# Patient Record
Sex: Female | Born: 1950 | Race: White | Hispanic: No | Marital: Married | Smoking: Never smoker
Health system: Southern US, Community
[De-identification: ages and names within clinical notes are randomized; demographics above are authoritative.]

## PROBLEM LIST (undated history)

## (undated) HISTORY — PX: CHOLECYSTECTOMY: SHX55

## (undated) HISTORY — PX: BREAST BIOPSY: SHX20

## (undated) HISTORY — PX: SPLENECTOMY, TOTAL: SHX788

---

## 1998-10-07 ENCOUNTER — Other Ambulatory Visit: Admission: RE | Admit: 1998-10-07 | Discharge: 1998-10-07 | Payer: Self-pay | Admitting: Obstetrics & Gynecology

## 1998-12-24 ENCOUNTER — Inpatient Hospital Stay (HOSPITAL_COMMUNITY): Admission: AD | Admit: 1998-12-24 | Discharge: 1998-12-24 | Payer: Self-pay | Admitting: Obstetrics & Gynecology

## 2001-01-12 ENCOUNTER — Other Ambulatory Visit: Admission: RE | Admit: 2001-01-12 | Discharge: 2001-01-12 | Payer: Self-pay | Admitting: Obstetrics and Gynecology

## 2003-05-13 ENCOUNTER — Other Ambulatory Visit: Admission: RE | Admit: 2003-05-13 | Discharge: 2003-05-13 | Payer: Self-pay | Admitting: Obstetrics & Gynecology

## 2004-06-01 ENCOUNTER — Other Ambulatory Visit: Admission: RE | Admit: 2004-06-01 | Discharge: 2004-06-01 | Payer: Self-pay | Admitting: Obstetrics & Gynecology

## 2005-06-03 ENCOUNTER — Other Ambulatory Visit: Admission: RE | Admit: 2005-06-03 | Discharge: 2005-06-03 | Payer: Self-pay | Admitting: Obstetrics & Gynecology

## 2008-03-03 ENCOUNTER — Emergency Department (HOSPITAL_COMMUNITY): Admission: EM | Admit: 2008-03-03 | Discharge: 2008-03-03 | Payer: Self-pay | Admitting: Emergency Medicine

## 2011-03-05 ENCOUNTER — Ambulatory Visit (INDEPENDENT_AMBULATORY_CARE_PROVIDER_SITE_OTHER): Payer: BC Managed Care – PPO

## 2011-03-05 ENCOUNTER — Inpatient Hospital Stay (INDEPENDENT_AMBULATORY_CARE_PROVIDER_SITE_OTHER)
Admission: RE | Admit: 2011-03-05 | Discharge: 2011-03-05 | Disposition: A | Discharge status: Home / Self Care | Payer: BC Managed Care – PPO | Source: Ambulatory Visit | Attending: Family Medicine | Admitting: Family Medicine

## 2011-03-05 DIAGNOSIS — S9030XA Contusion of unspecified foot, initial encounter: Secondary | ICD-10-CM

## 2011-03-16 ENCOUNTER — Inpatient Hospital Stay (INDEPENDENT_AMBULATORY_CARE_PROVIDER_SITE_OTHER)
Admission: RE | Admit: 2011-03-16 | Discharge: 2011-03-16 | Disposition: A | Discharge status: Home / Self Care | Payer: BC Managed Care – PPO | Source: Ambulatory Visit | Attending: Family Medicine | Admitting: Family Medicine

## 2011-03-16 ENCOUNTER — Ambulatory Visit (INDEPENDENT_AMBULATORY_CARE_PROVIDER_SITE_OTHER): Payer: BC Managed Care – PPO

## 2011-03-16 DIAGNOSIS — S92309A Fracture of unspecified metatarsal bone(s), unspecified foot, initial encounter for closed fracture: Secondary | ICD-10-CM

## 2012-02-21 ENCOUNTER — Emergency Department (HOSPITAL_COMMUNITY)
Admission: EM | Admit: 2012-02-21 | Discharge: 2012-02-21 | Disposition: A | Discharge status: Home / Self Care | Payer: BC Managed Care – PPO | Source: Home / Self Care | Attending: Family Medicine | Admitting: Family Medicine

## 2012-02-21 ENCOUNTER — Encounter (HOSPITAL_COMMUNITY): Payer: Self-pay | Admitting: Emergency Medicine

## 2012-02-21 DIAGNOSIS — R071 Chest pain on breathing: Secondary | ICD-10-CM

## 2012-02-21 MED ORDER — IBUPROFEN 600 MG PO TABS: 600.0000 mg | ORAL_TABLET | Freq: Three times a day (TID) | ORAL | Status: AC

## 2012-02-21 MED ORDER — ACETAMINOPHEN-CODEINE #3 300-30 MG PO TABS: 1.0000 | ORAL_TABLET | Freq: Four times a day (QID) | ORAL | Status: AC | PRN

## 2012-02-21 NOTE — ED Provider Notes (Signed)
History     CSN: [id number]  Arrival date & time 02/21/12  1547   First MD Initiated Contact with Patient 02/21/12 1556      Chief Complaint  Patient presents with  . 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 Here complaining of bilateral pain in the lower ribs more in the anterior area. She has had this discomfort for about 3 weeks and started after she was sick with a cold and was coughing constantly. She states her cold and cough has completely resolved. But the bilateral rib cage pain although minimally improved remains constant. Denies fever or chills. Denies dizziness, palpitations or syncope. No leg swelling. Pain worse with movement, sneezing laughing or straining. Denies recent trauma or falls. No difficulty breathing other than triggering her pain with deep breath. Not taking any medications for her pain.    History reviewed. No pertinent past medical history.  Past Surgical History  Procedure Date  . Breast biopsy   . Splenectomy, total     No family history on file.  History  Substance Use Topics  . Smoking status: Never Smoker   . Smokeless tobacco: Not on file  . Alcohol Use: Yes    OB History    Grav Para Term Preterm Abortions TAB SAB Ect Mult Living                  Review of Systems  Constitutional: Negative for fever, chills and appetite change.  HENT: Negative for congestion.   Respiratory: Negative for cough, shortness of breath and wheezing.   Cardiovascular: Positive for chest pain. Negative for palpitations and leg swelling.  Gastrointestinal: Negative for nausea, vomiting, abdominal pain and diarrhea.  Musculoskeletal:       As per HPI  Skin: Negative for rash.  Neurological: Negative for dizziness and headaches.    Allergies  Review of  patient's allergies indicates no known allergies.  Home Medications   Current Outpatient Rx  Name Route Sig Dispense Refill  . SERTRALINE HCL 100 MG PO TABS Oral Take 100 mg by mouth daily.    . ACETAMINOPHEN-CODEINE #3 300-30 MG PO TABS Oral Take 1-2 tablets by mouth every 6 (six) hours as needed for pain. 15 tablet 0  . IBUPROFEN 600 MG PO TABS Oral Take 1 tablet (600 mg total) by mouth 3 (three) times daily. 20 tablet 0    BP 117/69  Pulse 74  Temp(Src) 98.7 F (37.1 C) (Oral)  Resp 14  SpO2 97%  Physical Exam  Nursing note and vitals reviewed. Constitutional: She is oriented to person, place, and time. She appears well-developed and well-nourished. No distress.  HENT:  Head: Normocephalic and atraumatic.  Eyes: Conjunctivae are normal. Pupils are equal, round, and reactive to light. No scleral icterus.  Neck: No JVD present.  Cardiovascular: Normal rate, regular rhythm and normal heart sounds.  Exam reveals no gallop and no friction rub.   No murmur heard. Pulmonary/Chest: Effort normal and breath sounds normal. No respiratory distress. She has no wheezes. She has no rales.       Mild pectus scavatum at the expense of lower ribs. Tender lower bilateral costo-sternal joints with light palpation. No obvious deformity or crepitus while palpating ribs. No bruising or swelling. No skin rashes.  Otherwise normal chest expansion and bilateral lung exam.  Abdominal: Soft. Bowel sounds are normal. She exhibits  no distension. There is no tenderness.  Lymphadenopathy:    She has no cervical adenopathy.  Neurological: She is alert and oriented to person, place, and time.  Skin: No rash noted. She is not diaphoretic.    ED Course  Procedures (including critical care time)  Labs Reviewed - No data to display No results found.   1. Costochondral pain       MDM  Reproduced pain over costosternal joints by applying lower ribs pressure. Otherwise normal lung and cardiovascular  exam. Impress costochondritis. Treated symptomatically with ibuprofen 600 mg 3 times a day schedule for 5 days plus Tylenol #3 every 6 hours when necessary. Return if persistent pain after 1 week or return earlier if worsening or new symptoms.         Sharin Grave, MD 02/22/12 [phone]

## 2012-02-21 NOTE — ED Notes (Signed)
Reports 2-3 weeks of torso discomfort.  Pain around torso.  Pain worsens with any movement, laughter, cough.  Patient reports cough around [place] time.  Patient reports minimal improvement over the past few weeks.  Patient reports pain is wearing her down.

## 2012-02-21 NOTE — Discharge Instructions (Signed)
My impression is that you have pain involving the cartilage joints between your ribs and flat chest bone in the center of your chest that are likely inflamed due to recent coughing episodes. It is also possible your have abdominal muscles, fatigued or swollen from recent cough.  Take the prescribed medications as instructed. Take ibuprofen scheduled for the next 5 days taken with food as it can upset your stomach. You can also take over-the-counter Prilosec 20 mg one time daily while taking the ibuprofen. Can also alternate with Tylenol #3 every 6 hours as needed for pain. Avoid heavy lifting over 3 pounds for at least one week. Return or followup with your primary care provider if persistent and symptoms after 1 week. Return earlier if worsening or new symptoms despite following treatment.

## 2012-03-21 ENCOUNTER — Institutional Professional Consult (permissible substitution): Payer: BC Managed Care – PPO | Admitting: Pulmonary Disease

## 2012-05-07 IMAGING — CR DG FOOT COMPLETE 3+V*R*
3 series · 3 of 3 positions shown · non-contrast
Comparison: None

CLINICAL DATA: Trauma to the second and third metatarsals.  Soft
tissue swelling.

RIGHT FOOT COMPLETE - 3+ VIEW

[view not recorded (1 of 3)]
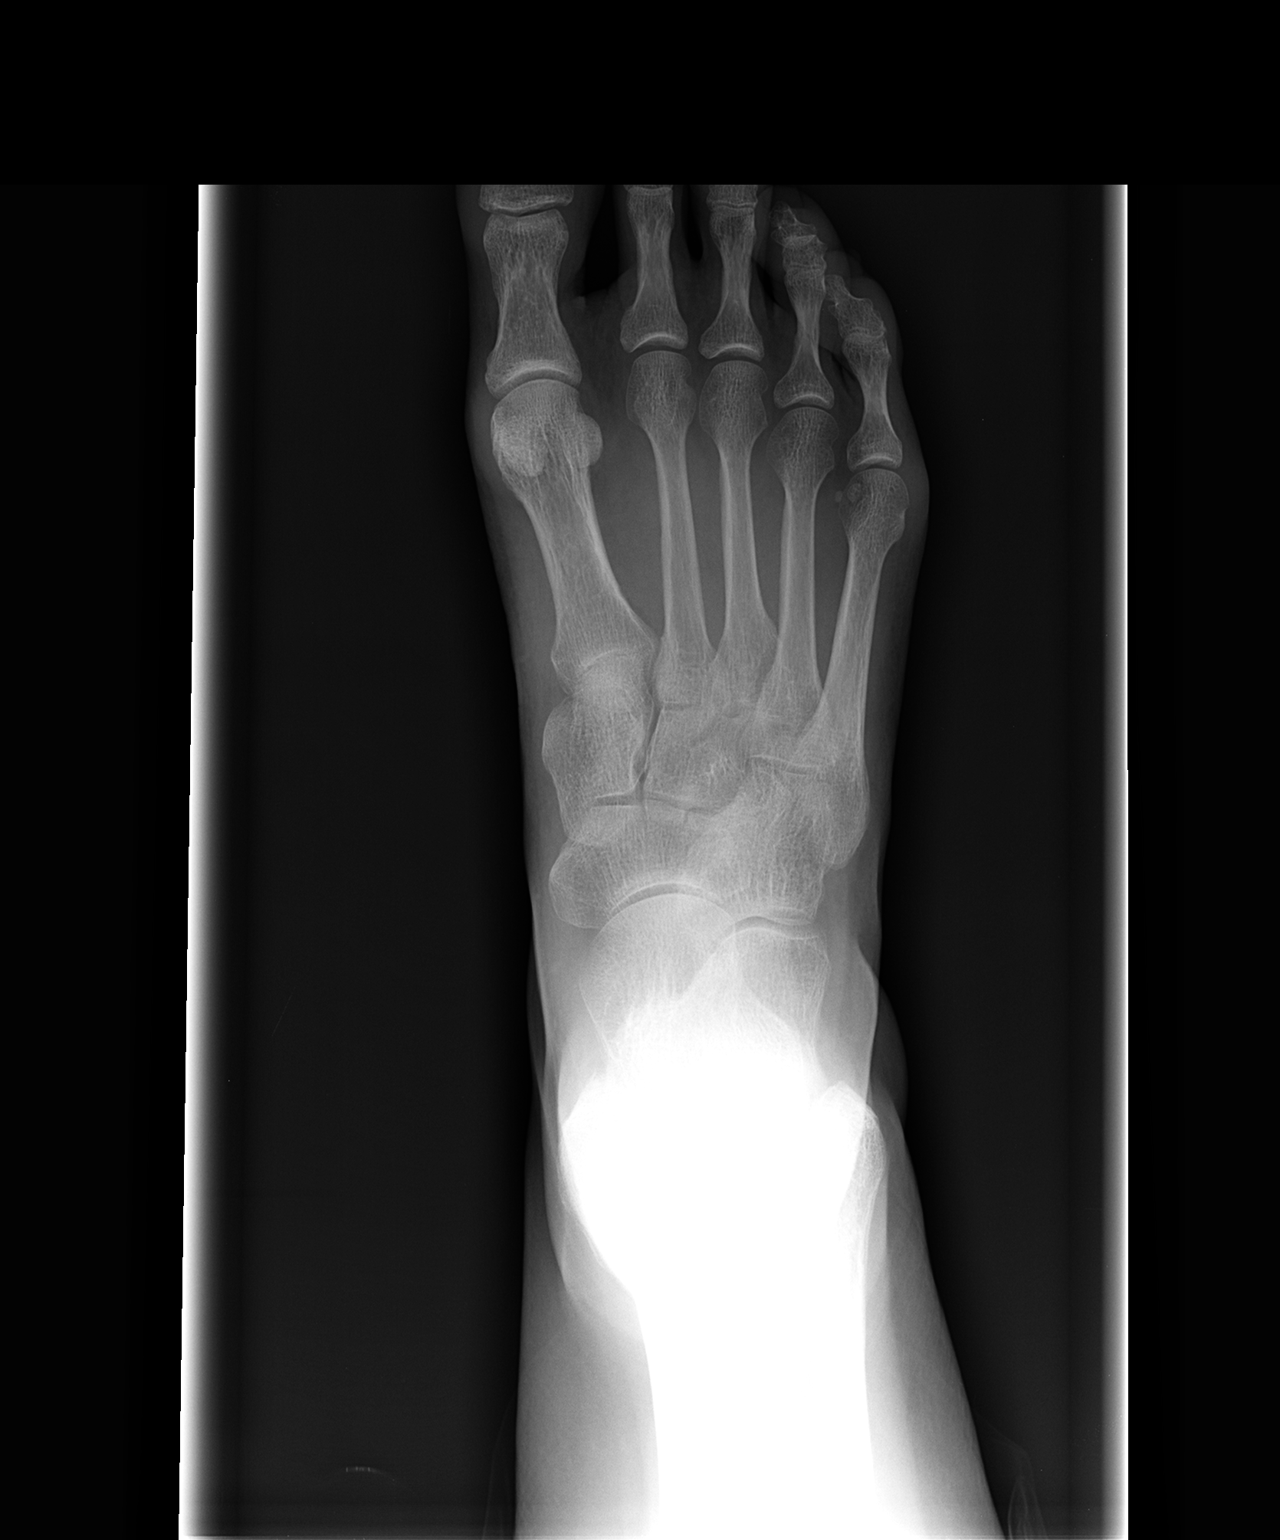

[view not recorded (2 of 3)]
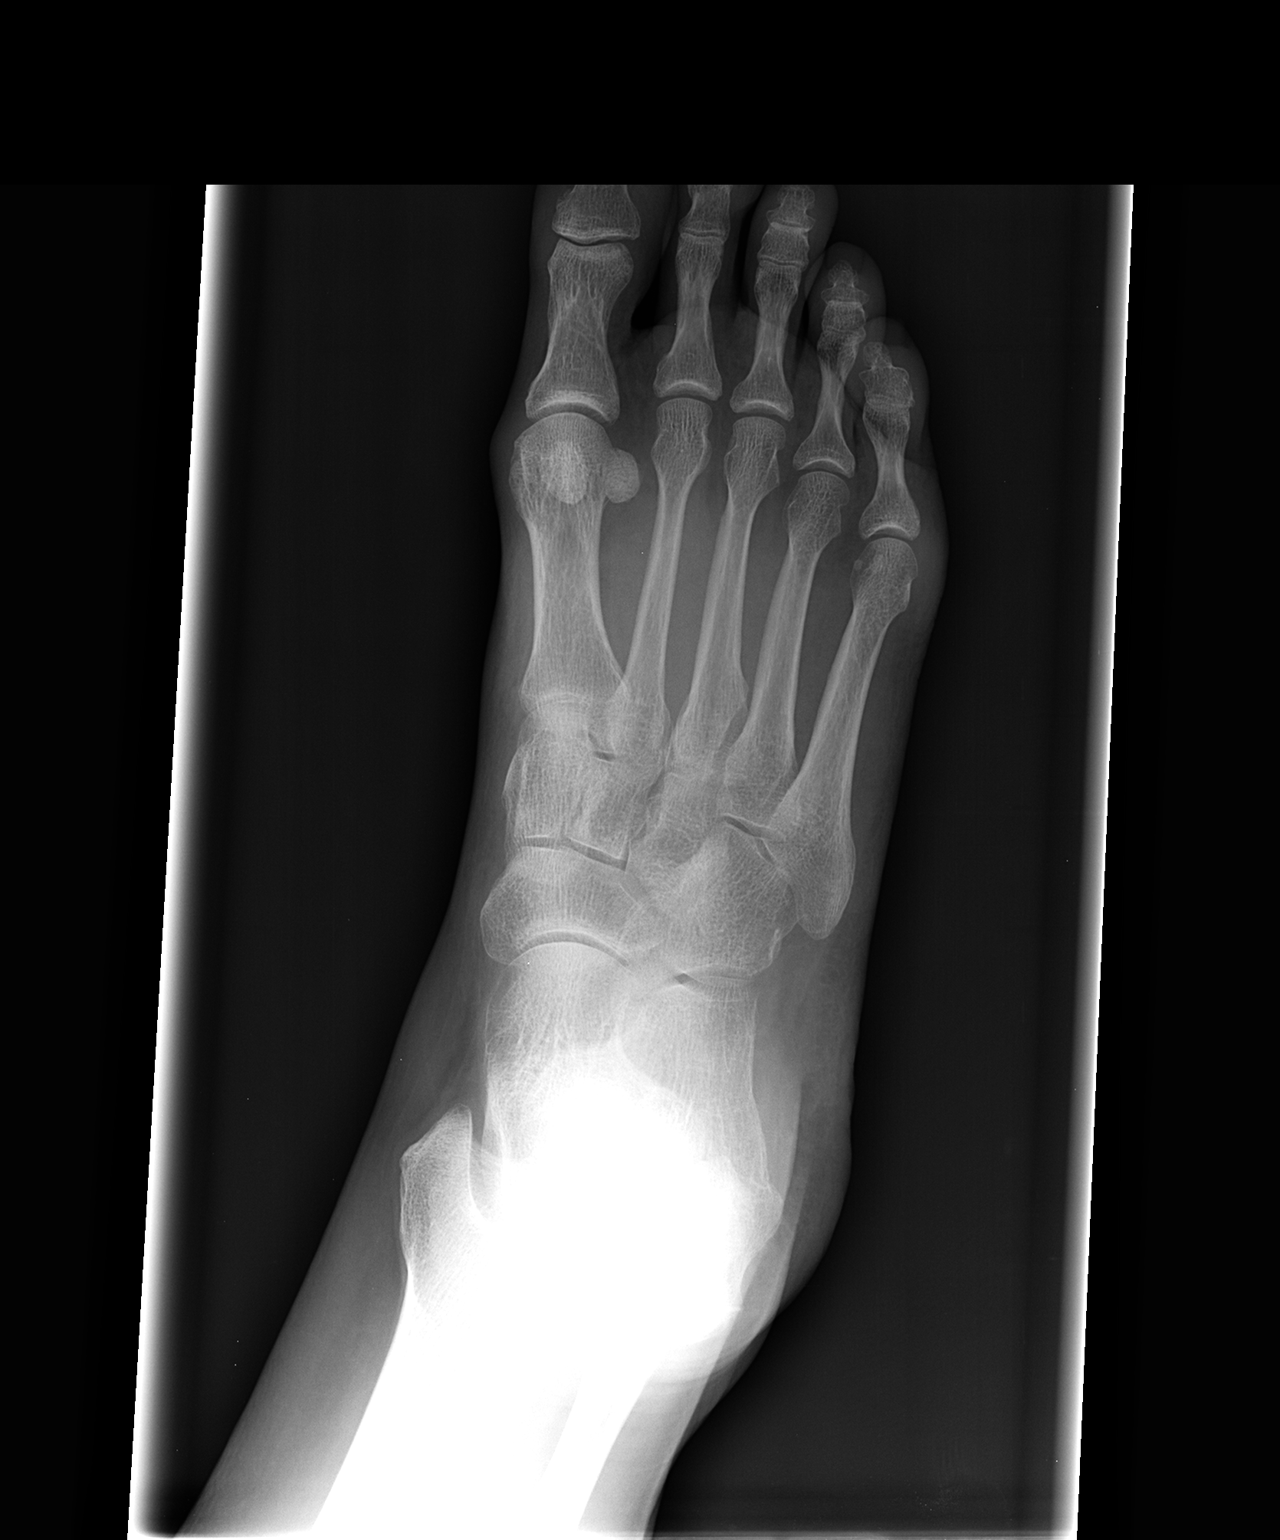

[view not recorded (3 of 3)]
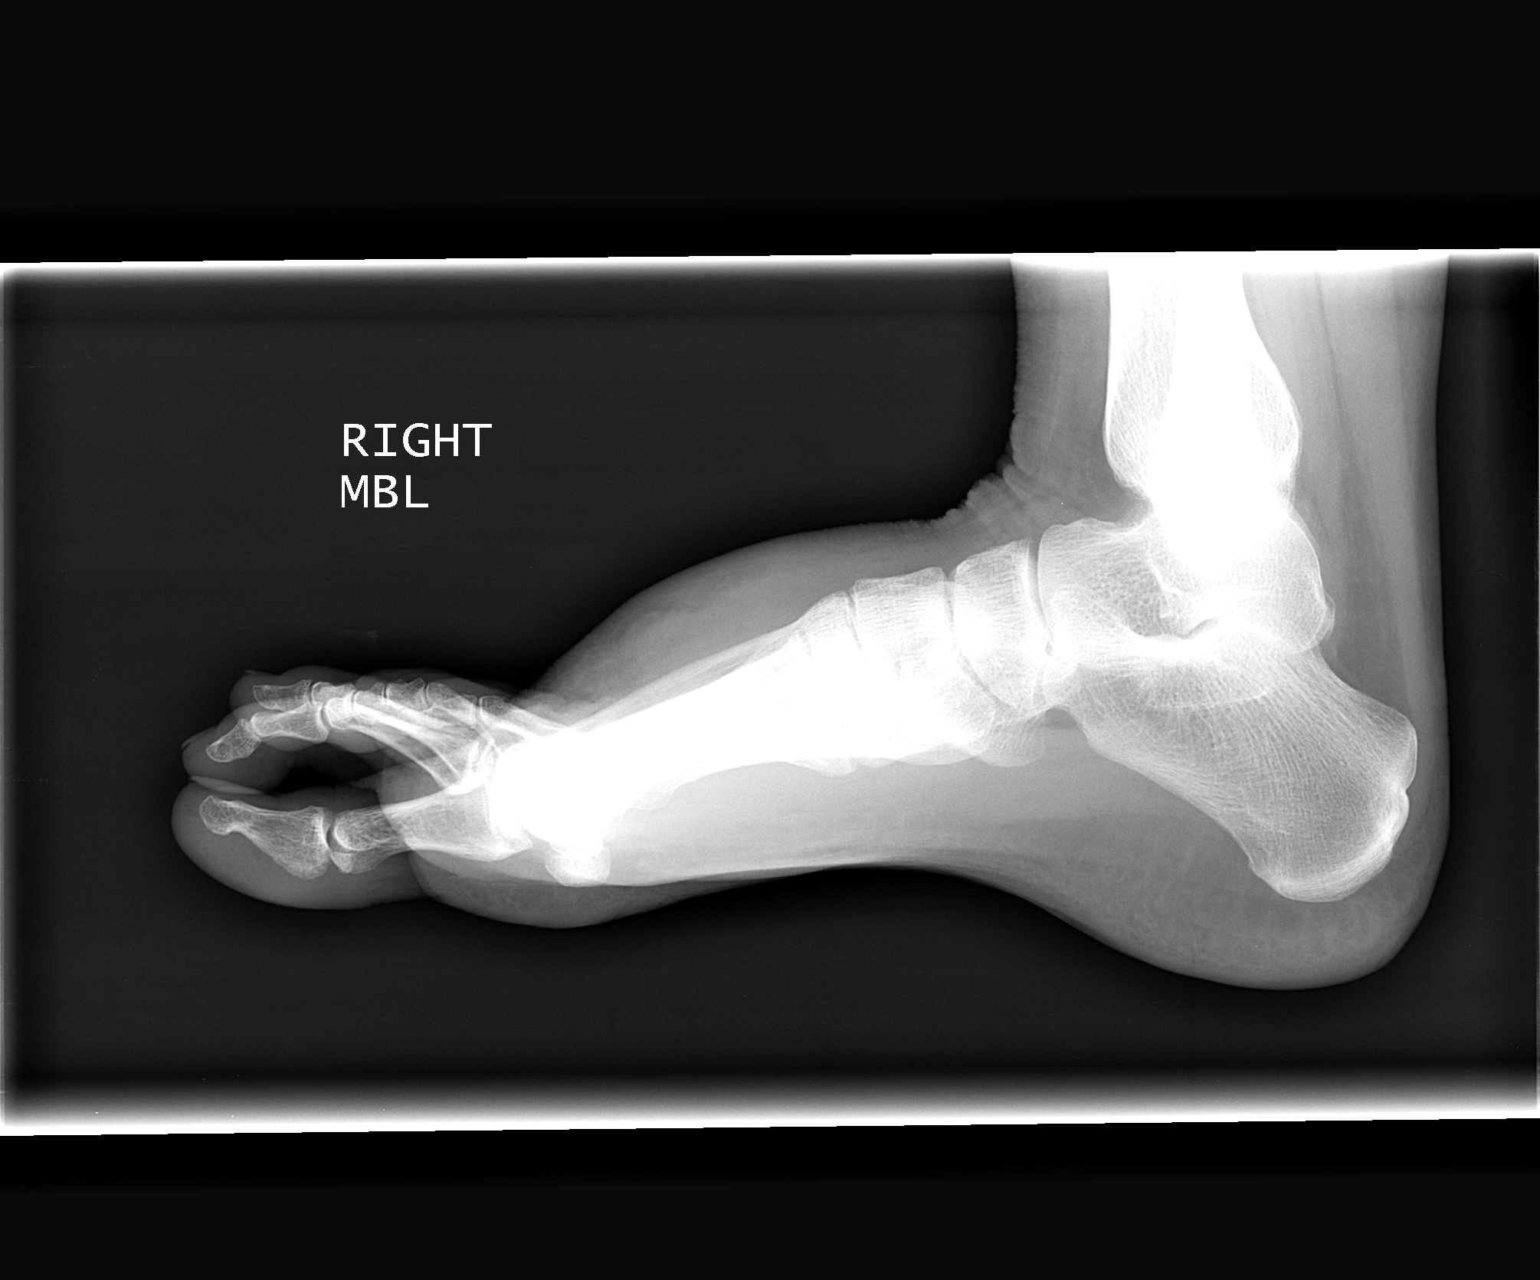

[3 of 3 positions shown; findings below may reference images not displayed]

FINDINGS: No evidence of fracture or dislocation.  No other bony
finding.  There is pronounced dorsal soft tissue swelling.
IMPRESSION: Soft tissue swelling.  No bony abnormality.  No radiopaque foreign
object.

## 2013-06-22 ENCOUNTER — Encounter (HOSPITAL_COMMUNITY): Payer: Self-pay | Admitting: Emergency Medicine

## 2013-06-22 ENCOUNTER — Emergency Department (HOSPITAL_COMMUNITY)
Admission: EM | Admit: 2013-06-22 | Discharge: 2013-06-22 | Disposition: A | Discharge status: Home / Self Care | Payer: BC Managed Care – PPO | Source: Home / Self Care

## 2013-06-22 DIAGNOSIS — L255 Unspecified contact dermatitis due to plants, except food: Secondary | ICD-10-CM

## 2013-06-22 MED ORDER — METHYLPREDNISOLONE 4 MG PO KIT: PACK | ORAL | Status: DC

## 2013-06-22 MED ORDER — TRIAMCINOLONE ACETONIDE 0.1 % EX CREA: TOPICAL_CREAM | Freq: Two times a day (BID) | CUTANEOUS | Status: DC

## 2013-06-22 NOTE — ED Provider Notes (Signed)
Medical screening examination/treatment/procedure(s) were performed by resident physician or non-physician practitioner and as supervising physician I was immediately available for consultation/collaboration.   Barkley Bruns MD.   Linna Hoff, MD 06/22/13 [phone]

## 2013-06-22 NOTE — ED Provider Notes (Signed)
  CSN: [id number]     Arrival date & time 06/22/13  8295 History     None    Chief Complaint  Patient presents with  . Poison Ivy   (Consider location/radiation/quality/duration/timing/severity/associated sxs/prior Treatment) HPI Comments: C/O poison ivy rash for 5 d. Pruritis. Located on small areas of upper and lower extremities, neck and waistline.   History reviewed. No pertinent past medical history. Past Surgical History  Procedure Laterality Date  . Breast biopsy    . Splenectomy, total     No family history on file. History  Substance Use Topics  . Smoking status: Never Smoker   . Smokeless tobacco: Not on file  . Alcohol Use: Yes   OB History   Grav Para Term Preterm Abortions TAB SAB Ect Mult Living                 Review of Systems  Constitutional: Negative for fever, chills, diaphoresis, activity change, appetite change and fatigue.  HENT: Negative for sore throat, facial swelling and trouble swallowing.   Skin: Positive for rash.    Allergies  Review of patient's allergies indicates no known allergies.  Home Medications   Current Outpatient Rx  Name  Route  Sig  Dispense  Refill  . sertraline (ZOLOFT) 100 MG tablet   Oral   Take 100 mg by mouth daily.         . methylPREDNISolone (MEDROL DOSEPAK) 4 MG tablet      follow package directions   21 tablet   0   . triamcinolone cream (KENALOG) 0.1 %   Topical   Apply topically 2 (two) times daily. Apply for 2 weeks. May use on face   30 g   0    BP 130/77  Pulse 77  Temp(Src) 98.2 F (36.8 C) (Oral)  Resp 16  SpO2 100% Physical Exam  Nursing note and vitals reviewed. Constitutional: She is oriented to person, place, and time. She appears well-developed and well-nourished. No distress.  HENT:  Mouth/Throat: Oropharynx is clear and moist.  Pulmonary/Chest: Effort normal.  Neurological: She is alert and oriented to person, place, and time. She exhibits normal muscle tone.  Skin: Skin is  warm and dry. Rash noted.  Crops of papulovesicular lesions, streaks on a red base located in areas described above.   Psychiatric: She has a normal mood and affect.    ED Course   Procedures (including critical care time)  Labs Reviewed - No data to display No results found. 1. Contact dermatitis and other eczema due to plants (except food)     MDM  Triamcinolone cream bid t affected areas Medrol dose pack #21 OTC caladryl gel  Hayden Rasmussen, NP 06/22/13 1005

## 2013-06-22 NOTE — ED Notes (Signed)
Pt c/o poison ivy onset 5 days Rash on left arm, and legs Denies: fevers... Alert w/no signs of acute distress.

## 2014-08-19 ENCOUNTER — Emergency Department (HOSPITAL_COMMUNITY)
Admission: EM | Admit: 2014-08-19 | Discharge: 2014-08-19 | Disposition: A | Discharge status: Home / Self Care | Payer: BC Managed Care – PPO | Source: Home / Self Care | Attending: Family Medicine | Admitting: Family Medicine

## 2014-08-19 ENCOUNTER — Encounter (HOSPITAL_COMMUNITY): Payer: Self-pay | Admitting: Emergency Medicine

## 2014-08-19 ENCOUNTER — Ambulatory Visit (HOSPITAL_COMMUNITY): Payer: BC Managed Care – PPO | Attending: Family Medicine

## 2014-08-19 DIAGNOSIS — R05 Cough: Secondary | ICD-10-CM

## 2014-08-19 DIAGNOSIS — Q676 Pectus excavatum: Secondary | ICD-10-CM | POA: Insufficient documentation

## 2014-08-19 DIAGNOSIS — R059 Cough, unspecified: Secondary | ICD-10-CM

## 2014-08-19 MED ORDER — BENZONATATE 100 MG PO CAPS: 100.0000 mg | ORAL_CAPSULE | Freq: Three times a day (TID) | ORAL | Status: AC | PRN

## 2014-08-19 MED ORDER — OMEPRAZOLE 40 MG PO CPDR
40.0000 mg | DELAYED_RELEASE_CAPSULE | Freq: Every day | ORAL | Status: DC
Start: 2014-08-19 — End: 2014-09-17

## 2014-08-19 MED ORDER — IPRATROPIUM-ALBUTEROL 0.5-2.5 (3) MG/3ML IN SOLN
RESPIRATORY_TRACT | Status: AC
  Filled 2014-08-19: qty 3

## 2014-08-19 MED ORDER — IPRATROPIUM-ALBUTEROL 0.5-2.5 (3) MG/3ML IN SOLN
3.0000 mL | Freq: Once | RESPIRATORY_TRACT | Status: AC
  Administered 2014-08-19: 3 mL via RESPIRATORY_TRACT

## 2014-08-19 NOTE — Discharge Instructions (Signed)
Thank you for coming in today. Take tessalon as needed.  Take over the counter zyrtec as needed.  If not getting better take omeprazole daily.  Call or go to the emergency room if you get worse, have trouble breathing, have chest pains, or palpitations.  Follow up with a primary doctor.   PRIMARY CARE Merchant navy officerDOCTORS [facility] [address]  [place], [place] [phone]  Fax [phone]  Nature conservation [facility] at [facility] [address]  [place], [place] [phone]  Fax [phone]  Nature conservation officerLeBauer [facility] at [facility] [place] / [place] [phone]  [place], [place] [phone]  Fax [phone]  [facility] [address]  [place], [place] [phone]  Fax [phone]  [facility] [address] [place]  [place], [place] [phone]  Fax [phone]  [facility] [address] [place]  [place], [place] [phone]  Fax [phone]   [facility] [place] [address] [place] [address] Phone: [phone]   [facility] @ [facility] [address] [place] [address] Phone: [phone]   [facility] @ [place] [address] [place] [address] Phone: [phone]   [facility] @ [place] [address] [place] [address] Phone: [phone]   [facility] @ [place] [address] [place] [address] Phone: [phone] Fax: [phone]   [facility] @ [place] [address] [place] [address] Phone: [phone]   Dr. Maryelizabeth RowanElizabeth Dewey [address] [place] [address] [phone]   Cough, Adult  A cough is a reflex that helps clear your throat and airways. It can help heal the body or  may be a reaction to an irritated airway. A cough may only last 2 or 3 weeks (acute) or may last more than 8 weeks (chronic).  CAUSES Acute cough:  Viral or bacterial infections. Chronic cough:  Infections.  Allergies.  Asthma.  Post-nasal drip.  Smoking.  Heartburn or acid reflux.  Some medicines.  Chronic lung problems (COPD).  Cancer. SYMPTOMS   Cough.  Fever.  Chest pain.  Increased breathing rate.  High-pitched whistling sound when breathing (wheezing).  Colored mucus that you cough up (sputum). TREATMENT   A bacterial cough may be treated with antibiotic medicine.  A viral cough must run its course and will not respond to antibiotics.  Your caregiver may recommend other treatments if you have a chronic cough. HOME CARE INSTRUCTIONS   Only take over-the-counter or prescription medicines for pain, discomfort, or fever as directed by your caregiver. Use cough suppressants only as directed by your caregiver.  Use a cold steam vaporizer or humidifier in your bedroom or home to help loosen secretions.  Sleep in a semi-upright position if your cough is worse at night.  Rest as needed.  Stop smoking if you smoke. SEEK IMMEDIATE MEDICAL CARE IF:   You have pus in your sputum.  Your cough starts to worsen.  You cannot control your cough with suppressants and are losing sleep.  You begin coughing up blood.  You have difficulty breathing.  You develop pain which is getting worse or is uncontrolled with medicine.  You have a fever. MAKE SURE YOU:  Understand these instructions.  Will watch your condition.  Will get help right away if you are not doing well or get worse. Document Released: 04/22/2011 Document Revised: 01/16/2012 Document Reviewed: 04/22/2011 [facility] Patient Information 2015 [place], [place]. This information is not intended to replace advice given to you by your health care provider. Make sure you discuss any questions you have  with your health care provider.

## 2014-08-19 NOTE — ED Provider Notes (Signed)
Juliette AlcideDeborah D Bosshart is a 63 y.o. female who presents to Urgent Care today for cough. Patient has a history of cough for 5 weeks. She does muscle soreness with coughing. She had wheezing initially but now none. She's tried some Delsym which helps. No fevers or chills nausea vomiting or diarrhea. No significant reflux. No history of asthma. No smoking history.   History reviewed. No pertinent past medical history. History  Substance Use Topics  . Smoking status: Never Smoker   . Smokeless tobacco: Not on file  . Alcohol Use: Yes   ROS as above Medications: No current facility-administered medications for this encounter.   Current Outpatient Prescriptions  Medication Sig Dispense Refill  . sertraline (ZOLOFT) 100 MG tablet Take 100 mg by mouth daily.      . benzonatate (TESSALON) 100 MG capsule Take 1 capsule (100 mg total) by mouth 3 (three) times daily as needed for cough.  90 capsule  1  . omeprazole (PRILOSEC) 40 MG capsule Take 1 capsule (40 mg total) by mouth daily.  30 capsule  1    Exam:  BP 123/76  Pulse 75  Temp(Src) 98.1 F (36.7 C) (Oral)  Resp 16  SpO2 98% Gen: Well NAD HEENT: EOMI,  MMM normal posterior pharynx and tympanic membranes. Lungs: Normal work of breathing. CTABL Heart: RRR no MRG Abd: NABS, Soft. Nondistended, Nontender Exts: Brisk capillary refill, warm and well perfused.   Patient was given a 2.5 mg/0.5 mg DuoNeb nebulizer treatment, and did not feel much better  No results found for this or any previous visit (from the past 24 hour(s)). Dg Chest 2 View  08/19/2014   CLINICAL DATA:  Cough and difficulty breathing for 5 weeks  EXAM: CHEST  2 VIEW  COMPARISON:  None.  FINDINGS: Lungs are clear. Heart size and pulmonary vascularity are normal. No adenopathy. There is pectus excavatum. No bone lesions.  IMPRESSION: Pectus excavatum.  No edema or consolidation.   Electronically Signed   By: Bretta BangWilliam  Woodruff M.D.   On: 08/19/2014 18:17    Assessment and  Plan: 63 y.o. female with cough likely post viral. Treatment with Tessalon. Attempt treatment with Zyrtec. If not better treatment with omeprazole for possible reflux related cough.. Followup with PCP.  Discussed warning signs or symptoms. Please see discharge instructions. Patient expresses understanding.     Rodolph BongEvan S Tecla Mailloux, MD 08/19/14 [phone]

## 2014-08-19 NOTE — ED Notes (Signed)
Patient transported to X-ray. [facility] secondary to cxr unavailable at this location

## 2014-08-19 NOTE — ED Notes (Signed)
Returned from xray

## 2014-08-19 NOTE — ED Notes (Signed)
Cough, fatigue, pain per patient x 5 weeks.

## 2014-09-10 ENCOUNTER — Telehealth: Payer: Self-pay | Admitting: Internal Medicine

## 2014-09-10 NOTE — Telephone Encounter (Signed)
ATC patient- no answer and no voicemail to leave message; triage to try again tomorrow. We can have patient come in to see CY for pulmonary consult on Wednesday 09-24-14 at 11:15am slot. Thanks.

## 2014-09-10 NOTE — Telephone Encounter (Signed)
I called spoke with Hayley Riley. She reports she has been having an ongoing cough x 3 weeks. She has started wheezing as well. She schedule consult appt with MW on 11/11 at 8:45 (next available consult with any provider). Hayley Riley reports she is a neighbor of Dr. Maple HudsonYoung and wants to know if she can be worked in sooner by [employer]. Please advise thanks

## 2014-09-10 NOTE — Telephone Encounter (Signed)
I would be happy to see her if we can meet her scheduling needs. Please see what Hayley AddisonKatie can do.

## 2014-09-11 NOTE — Telephone Encounter (Signed)
Called and spoke with pt and she already has appt scheduled with MW on 11/11 for consult.  Nothing further is needed.

## 2014-09-17 ENCOUNTER — Ambulatory Visit (INDEPENDENT_AMBULATORY_CARE_PROVIDER_SITE_OTHER): Payer: BC Managed Care – PPO | Admitting: Internal Medicine

## 2014-09-17 ENCOUNTER — Encounter: Payer: Self-pay | Admitting: Internal Medicine

## 2014-09-17 VITALS — BP 102/62 | HR 70 | Ht 66.0 in | Wt 162.0 lb

## 2014-09-17 DIAGNOSIS — Z23 Encounter for immunization: Secondary | ICD-10-CM | POA: Diagnosis not present

## 2014-09-17 DIAGNOSIS — R05 Cough: Secondary | ICD-10-CM

## 2014-09-17 DIAGNOSIS — R058 Other specified cough: Secondary | ICD-10-CM | POA: Insufficient documentation

## 2014-09-17 MED ORDER — PREDNISONE 10 MG PO TABS
ORAL_TABLET | ORAL | Status: AC
Start: 2014-09-17 — End: ?

## 2014-09-17 MED ORDER — PANTOPRAZOLE SODIUM 40 MG PO TBEC: 40.0000 mg | DELAYED_RELEASE_TABLET | Freq: Every day | ORAL | Status: AC

## 2014-09-17 MED ORDER — TRAMADOL HCL 50 MG PO TABS: ORAL_TABLET | ORAL | Status: AC

## 2014-09-17 MED ORDER — FAMOTIDINE 20 MG PO TABS: ORAL_TABLET | ORAL | Status: AC

## 2014-09-17 NOTE — Patient Instructions (Signed)
Prevnar today   The key to effective treatment for your cough is eliminating the non-stop cycle of cough you're stuck in long enough to let your airway heal completely and then see if there is anything still making you cough once you stop the cough suppression, but this should take no more than 5 days to figure out  First take delsym two tsp every 12 hours and supplement if needed with  tramadol 50 mg up to 2 every 4 hours to suppress the urge to cough at all or even clear your throat. Swallowing water or using ice chips/non mint and menthol containing candies (such as lifesavers or sugarless jolly ranchers) are also effective.  You should rest your voice and avoid activities that you know make you cough.  Once you have eliminated the cough for 3 straight days try reducing the tramadol first,  then the delsym as tolerated.    Prednisone 10 mg take  4 each am x 2 days,   2 each am x 2 days,  1 each am x 2 days and stop (this is to eliminate allergies and inflammation from coughing)  Protonix (pantoprazole) Take 30-60 min before first meal of the day and Pepcid 20 mg one bedtime plus chlorpheniramine 4 mg x 2 at bedtime (both available over the counter)  until cough is completely gone for at least a week without the need for cough suppression  GERD (REFLUX)  is an extremely common cause of respiratory symptoms, many times with no significant heartburn at all.    It can be treated with medication, but also with lifestyle changes including avoidance of late meals, excessive alcohol, smoking cessation, and avoid fatty foods, chocolate, peppermint, colas, red wine, and acidic juices such as orange juice.  NO MINT OR MENTHOL PRODUCTS SO NO COUGH DROPS  USE HARD CANDY INSTEAD (jolley ranchers or Stover's or Lifesavers (all available in sugarless versions) NO OIL BASED VITAMINS - use powdered substitutes.     return in 2 weeks if not 100% better

## 2014-09-17 NOTE — Assessment & Plan Note (Signed)

## 2014-09-17 NOTE — Progress Notes (Signed)
   Subjective:    Patient ID: Hayley Riley, female    DOB: 09/07/1951,    MRN: [id number]  HPI  [age] yowf never smoker never had a resp problem at all until 2012 p uri cough x 6 weeks resolved s rx then recurred s uri prodrome but multiple fm members with similar symptom  Sept 20 2015 > [facility] > rx tessilon helped a lot but never completely resolved so referred by Dr Aldona BarWein who serves as primary to pulmonary clinic 09/17/2014   09/17/2014 1st [facility] office visit/ Hayley Riley   Chief Complaint  Patient presents with  . Pulmonary Consult    Self referral. Pt c/o cough x 8 wks- non prod. She has been having back and rib pain x 6 wks.   despite cough some better pain some worse but migratory in nature. Worse with laughing.  ? Some worse hs / once gets to sleep does ok Rib pain better with advil up to 2 q4h    Father died of mesothelioma and friend who died of lung ca and never smoker.  No obvious other patterns in day to day or daytime variabilty or assoc sob cp or chest tightness, subjective wheeze overt sinus or hb symptoms. No unusual exp hx or h/o childhood pna/ asthma or knowledge of premature birth.  Also denies any obvious fluctuation of symptoms with weather or environmental changes or other aggravating or alleviating factors except as outlined above   Current Medications, Allergies, Complete Past Medical History, Past Surgical History, Family History, and Social History were [facility].                 Review of Systems  Constitutional: Negative for fever, chills and unexpected weight change.  HENT: Negative for congestion, dental problem, ear pain, nosebleeds, postnasal drip, rhinorrhea, sinus pressure, sneezing, sore throat, trouble swallowing and voice change.   Eyes: Negative for visual disturbance.  Respiratory: Positive for cough. Negative for choking and shortness of breath.   Cardiovascular: Positive for chest  pain. Negative for leg swelling.  Gastrointestinal: Negative for vomiting, abdominal pain and diarrhea.  Genitourinary: Negative for difficulty urinating.  Musculoskeletal: Negative for arthralgias.  Skin: Negative for rash.  Neurological: Negative for tremors, syncope and headaches.  Hematological: Does not bruise/bleed easily.       Objective:   Physical Exam  Pleasant amb wf nad much younger than stated age  Wt Readings from Last 3 Encounters:  09/17/14 162 lb (73.483 kg)    Vital signs reviewed  HEENT: nl dentition, turbinates, and orophanx. Nl external ear canals without cough reflex   NECK :  without JVD/Nodes/TM/ nl carotid upstrokes bilaterally   LUNGS: no acc muscle use, clear to A and P bilaterally without cough on insp or exp maneuvers   CV:  RRR  no s3 or murmur or increase in P2, no edema   ABD:  soft and nontender with nl excursion in the supine position. No bruits or organomegaly, bowel sounds nl  MS:  warm without deformities, calf tenderness, cyanosis or clubbing  SKIN: warm and dry without lesions    NEURO:  alert, approp, no deficits    cxr 08/19/14 wnl       Assessment & Plan:

## 2014-09-17 NOTE — Addendum Note (Signed)
Addended by: Christen ButterASKIN, Leven Hoel M on: 09/17/2014 09:35 AM   Modules accepted: Orders

## 2015-10-22 IMAGING — CR DG CHEST 2V
2 series · 2 of 2 positions shown · non-contrast
Comparison: None.

CLINICAL DATA: Cough and difficulty breathing for 5 weeks

EXAM:
CHEST  2 VIEW

[w chest pa]
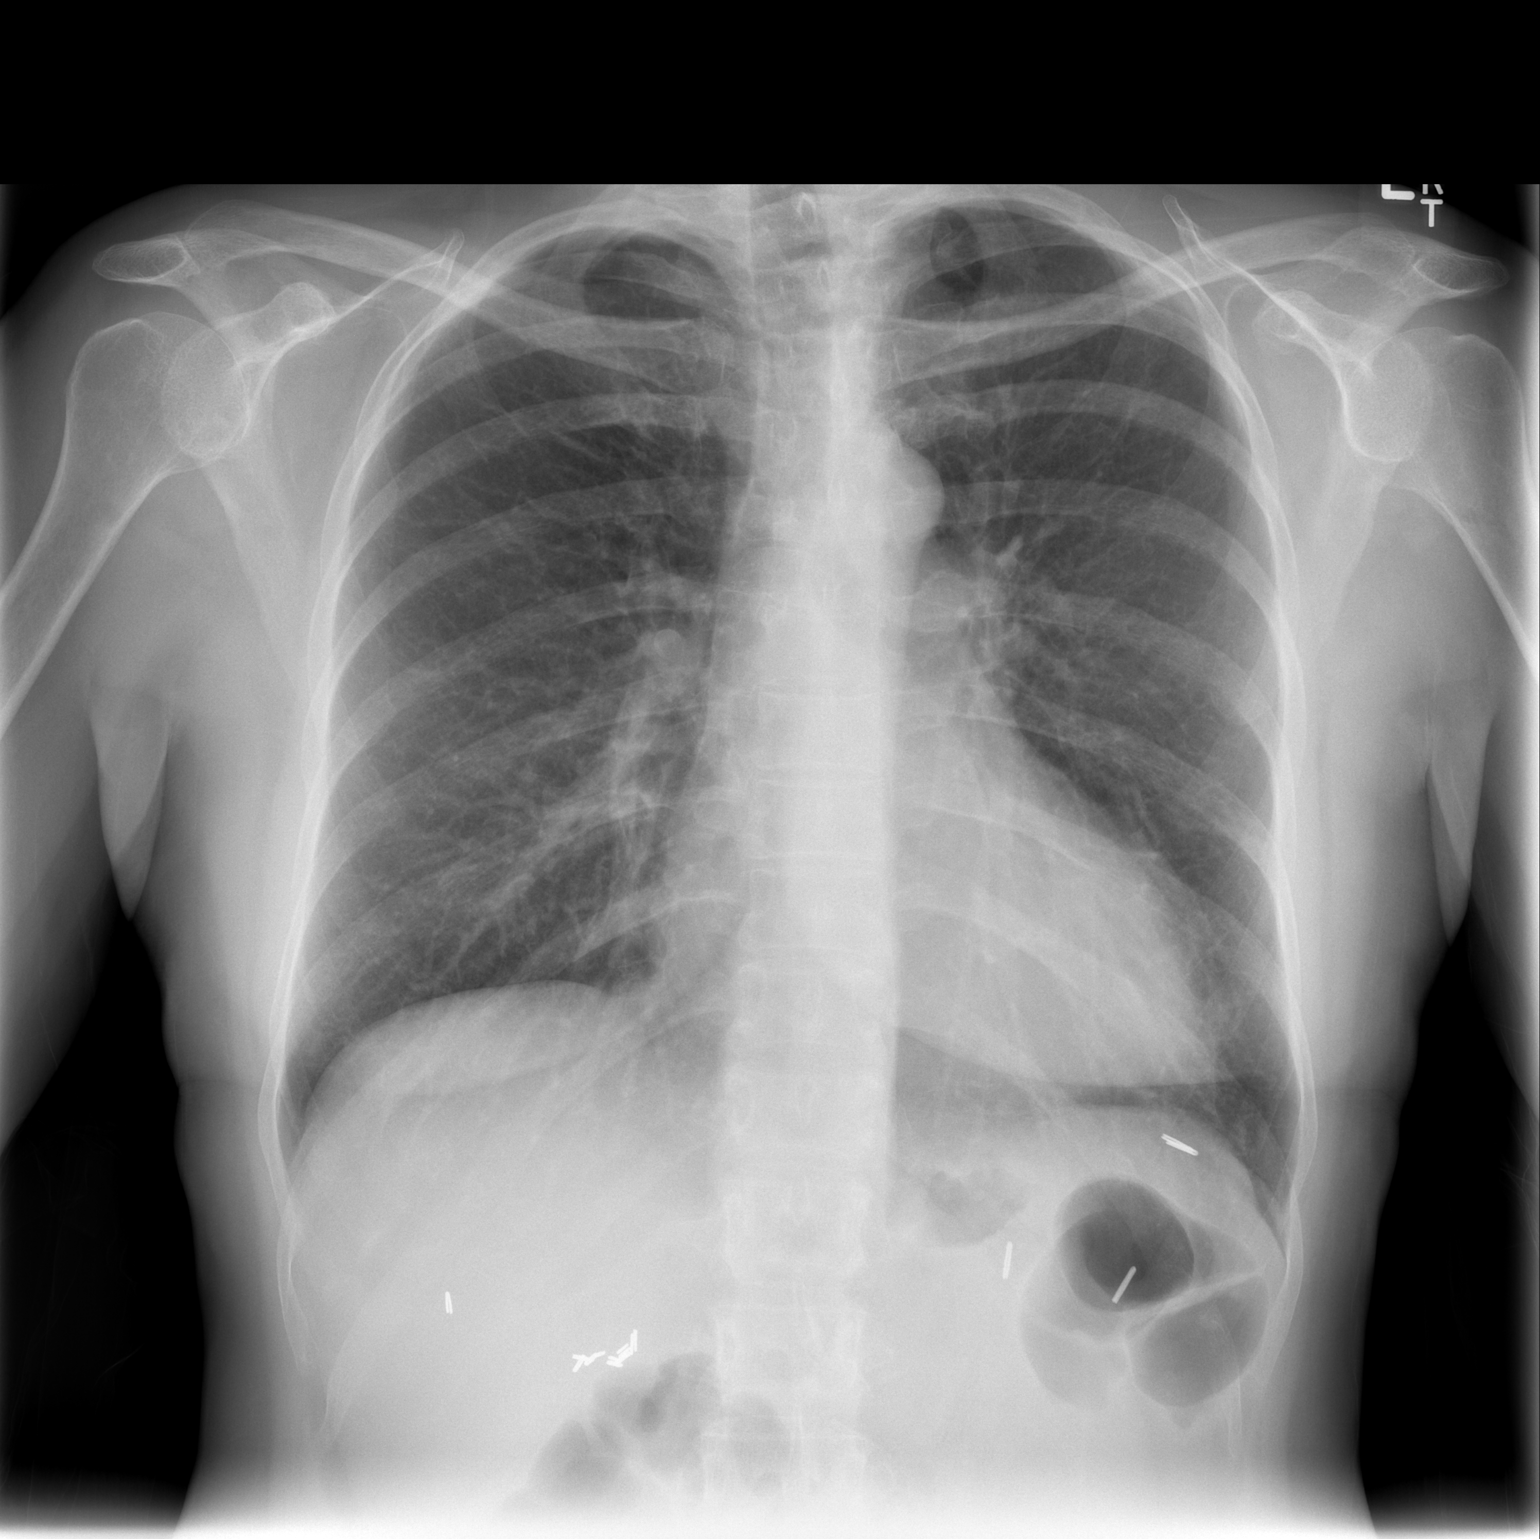

[w chest lat]
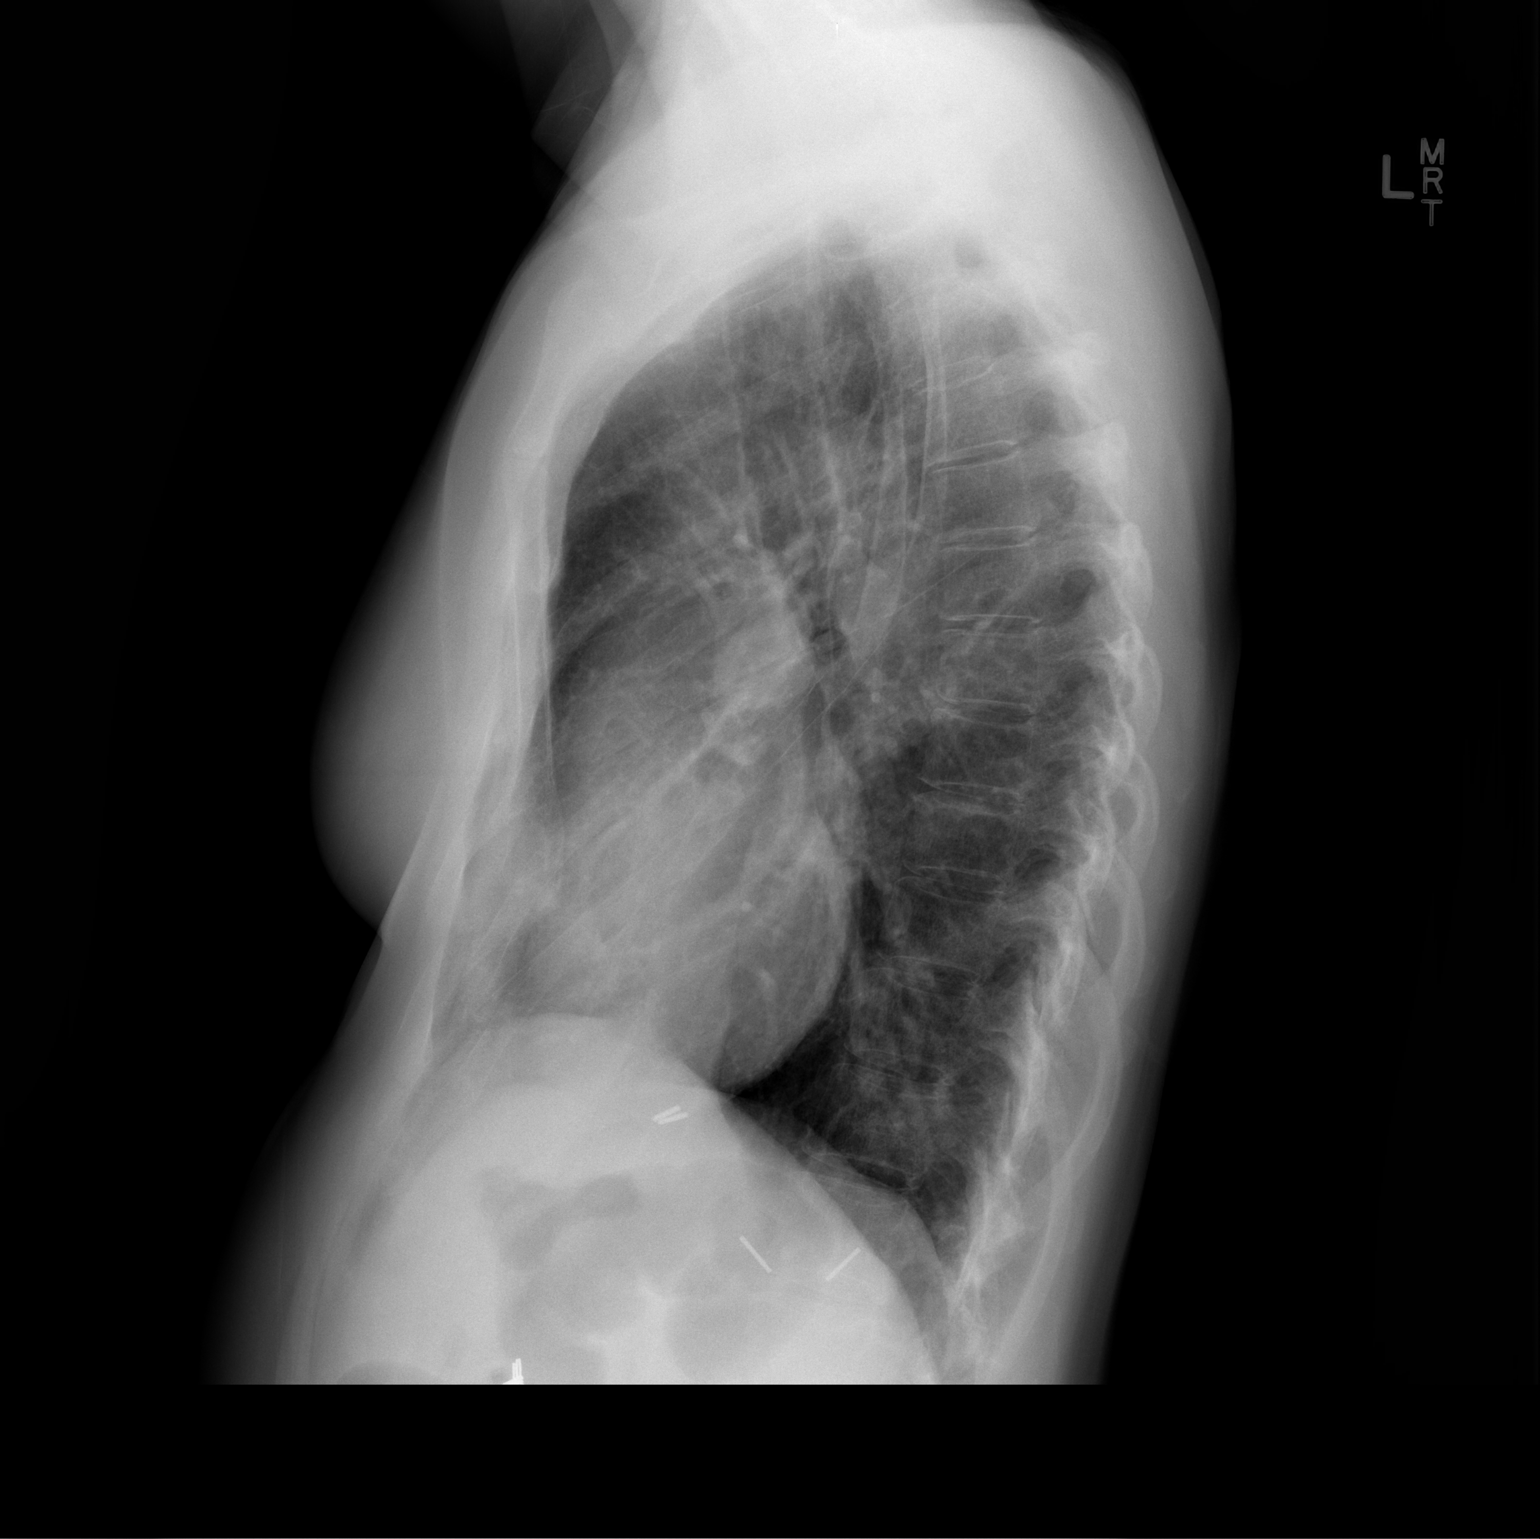

[2 of 2 positions shown; findings below may reference images not displayed]

FINDINGS: Lungs are clear. Heart size and pulmonary vascularity are normal. No
adenopathy. There is pectus excavatum. No bone lesions.
IMPRESSION: Pectus excavatum.  No edema or consolidation.
